# Patient Record
Sex: Female | Born: 1975 | Race: White | Hispanic: No | Marital: Single
Health system: Southern US, Community
[De-identification: ages and names within clinical notes are randomized; demographics above are authoritative.]

---

## 2014-03-28 ENCOUNTER — Other Ambulatory Visit: Payer: Self-pay | Admitting: Bariatrics

## 2014-03-28 DIAGNOSIS — R1011 Right upper quadrant pain: Secondary | ICD-10-CM

## 2014-04-12 ENCOUNTER — Ambulatory Visit
Admission: RE | Admit: 2014-04-12 | Discharge: 2014-04-12 | Disposition: A | Discharge status: Home / Self Care | Payer: BC Managed Care – PPO | Source: Ambulatory Visit | Attending: Bariatrics | Admitting: Bariatrics

## 2014-04-12 DIAGNOSIS — R1011 Right upper quadrant pain: Secondary | ICD-10-CM

## 2014-06-11 ENCOUNTER — Ambulatory Visit: Payer: Self-pay | Admitting: Bariatrics

## 2014-06-19 ENCOUNTER — Inpatient Hospital Stay: Payer: Self-pay | Admitting: Bariatrics

## 2014-06-20 LAB — BASIC METABOLIC PANEL
Anion Gap: 5 — ABNORMAL LOW (ref 7–16)
BUN: 4 mg/dL — ABNORMAL LOW (ref 7–18)
CALCIUM: 8.1 mg/dL — AB (ref 8.5–10.1)
Chloride: 108 mmol/L — ABNORMAL HIGH (ref 98–107)
Co2: 25 mmol/L (ref 21–32)
Creatinine: 0.75 mg/dL (ref 0.60–1.30)
EGFR (African American): >60
EGFR (Non-African Amer.): >60
Glucose: 108 mg/dL — ABNORMAL HIGH (ref 65–99)
Osmolality: 273 (ref 275–301)
Potassium: 3.8 mmol/L (ref 3.5–5.1)
SODIUM: 138 mmol/L (ref 136–145)

## 2014-06-20 LAB — CBC WITH DIFFERENTIAL/PLATELET
Basophil #: 0 10*3/uL (ref 0.0–0.1)
Basophil %: 0.3 %
EOS ABS: 0 10*3/uL (ref 0.0–0.7)
Eosinophil %: 0.6 %
HCT: 34.1 % — ABNORMAL LOW (ref 35.0–47.0)
HGB: 11.5 g/dL — AB (ref 12.0–16.0)
LYMPHS ABS: 2.1 10*3/uL (ref 1.0–3.6)
Lymphocyte %: 29.5 %
MCH: 31.7 pg (ref 26.0–34.0)
MCHC: 33.6 g/dL (ref 32.0–36.0)
MCV: 94 fL (ref 80–100)
MONOS PCT: 6.4 %
Monocyte #: 0.5 x10 3/mm (ref 0.2–0.9)
NEUTROS ABS: 4.6 10*3/uL (ref 1.4–6.5)
NEUTROS PCT: 63.2 %
PLATELETS: 240 10*3/uL (ref 150–440)
RBC: 3.62 10*6/uL — AB (ref 3.80–5.20)
RDW: 13.1 % (ref 11.5–14.5)
WBC: 7.3 10*3/uL (ref 3.6–11.0)

## 2014-06-20 LAB — MAGNESIUM: Magnesium: 1.8 mg/dL

## 2014-06-20 LAB — PHOSPHORUS: Phosphorus: 2.3 mg/dL — ABNORMAL LOW (ref 2.5–4.9)

## 2014-06-20 LAB — ALBUMIN: ALBUMIN: 2.9 g/dL — AB (ref 3.4–5.0)

## 2014-09-17 ENCOUNTER — Ambulatory Visit: Payer: Self-pay | Admitting: Physician Assistant

## 2014-10-27 NOTE — Op Note (Signed)
PATIENT NAME:  Desiree Phillips, Desiree Phillips MR#:  [id number] DATE OF BIRTH:  [birth year]  DATE OF PROCEDURE:  06/19/2014  PROCEDURE PERFORMED: Laparoscopic sleeve gastrectomy with a repair of hiatal hernia and cholecystectomy.   PREOPERATIVE DIAGNOSIS:  Long-standing morbid obesity with multiple attempts at dieting.    SECONDARY DIAGNOSIS:  Presence of hiatal hernia and gastroesophageal reflux as noted by upper GI series.  SYMPTOMS:  Presence of gallstones.   POSTOPERATIVE DIAGNOSIS:  Long-standing morbid obesity with multiple attempts at dieting with a 1.5 cm hiatal hernia.   PHYSICIANS IN ATTENDANCE:  Casimiro NeedleMichael A. Alva Garnetyner, MD.   ASSISTANT:  Anabel Halonon Drinkwater, PA.   PROCEDURE NOTE: The patient was brought to the operating room and placed in supine position. General anesthesia obtained with orotracheal intubation. The patient had TED hose and Thromboguards applied. A footboard applied at the end of the operative bed. The chest and abdomen were sterilely prepped and draped. A 5 mm Optiview trocar was introduced in the left upper quadrant of the abdomen under direct visualization. Pneumoperitoneum obtained. Three additional trocars introduced across the upper abdomen and a Nathanson liver retractor introduced through a subxiphoid defect. On elevation of the left lobe of the liver, the prior noted hiatal hernia was identifiable, with significant indentation of the peritoneum at the level of the hiatus. The patient then had division of the gastrohepatic ligament and division of the peritoneum across the anterior hiatus. Blunt dissection was used to reduce herniated peritoneum and also mobilize the esophagus and anterior vagal nerve away from the overlying pericardium. The patient then had division of the peritoneum just lateral to the right crural margin. Blunt dissection used to reduce herniated lesser sac fatty tissue. The phrenoesophageal ligament was then divided on the medial aspect of the left crus. This was then followed  by circumferential mobilization of the lower esophagus within the lower mediastinum. The esophagus and vagal nerves being swept away from the aorta, the pericardium, and the pleural surfaces. This was extended superiorly over a distance of approximately 8 cm, ultimately resulted in delivery of 1.5 cm of esophagus lying comfortably in the abdominal cavity. At this time, a posterior crural repair was performed with 3 interrupted 0 Ethibond sutures. The pylorus was then marked and approximately 4 cm proximal to this, there was division of the vascular pedicles along the greater curvature of the stomach with the dissection extended superiorly over a distance of approximately 8 cm. Posterior attachments of the stomach to the pancreas were taken down by use of the Harmonic scalpel. This was then followed by a series of GIA staple firings to create a medially based gastric tube effect. First firing was a green load staple, placed in relative transverse direction in an effort to avoid narrowing of the incisura. A second gold load staple was also placed in a similar fashion. This was followed by introduction of a 436 [place] ViSiGi device into the stapled aspect of the antrum and the remaining portion of the stomach decompressed. The patient had additional firings of gold load staples brought out parallel to the lesser curvature of the stomach to create a medially-based gastric tube effect. As this was being performed, the ViSiGi device was gradually withdrawn to create a consistent tubular effect. The stomach was occluded distally and with the ViSiGi device, insufflation of the stomach tube was performed. With a saline bath, no air leak identified. The ViSiGi device was withdrawn. The residual vascular pedicles of the greater curvature of the stomach divided, freeing both the gastrosplenic and gastrocolic  ligaments. The lateral stomach then retrieved through the right upper quadrant 12 mm trocar site. After confirming  hemostasis along the staple line, the apex of the stomach was affixed to the left crus with 0 Ethibond suture to prevent proximal migration of the stomach into the lower mediastinum. The attention redirected to the gallbladder. The peritoneum at the base of the gallbladder was scored by use of the Harmonic scalpel, exposing the base of the gallbladder and origin of the cystic duct. Immediately proximal to this, several branches of the cystic artery  divided by use of the Harmonic scalpel. A retrograde dissection of the gallbladder was then performed, freeing it from the liver bed. This was extended inferiorly to a point in which the cystic duct was reidentified and dissected further proximally over a distance of approximately 1.5 cm, then controlled by 0 PDS Endoloop, followed by division of the cystic duct near its junction with the gallbladder. The wall of the gallbladder and its contents cleared by way of the right upper quadrant trocar site. This did require retrieval of both liquid and several stones from the gallbladder to allow its removal from the abdominal cavity. The peritoneum and fascia of the defect then closed with 0 Vicryl suture, passed by way of a needle suture system under direct visualization.  The carbon dioxide released. The trocars were removed. The wounds injected with 0.25% Marcaine, closed with 4-0 Monocryl in the dermis, followed by Dermabond. The patient was allowed to recover at this point, having tolerated the procedure well with minimal blood loss.     ____________________________ Tyrone Apple Alva Garnet, MD mat:mw D: 06/19/2014 13:57:07 ET T: 06/19/2014 14:32:00 ET JOB#: [id number]  cc: Casimiro Needle A. Alva Garnet, MD, <Dictator> Everette Rank MD ELECTRONICALLY SIGNED 06/20/2014 8:38

## 2014-10-29 LAB — SURGICAL PATHOLOGY

## 2016-03-30 IMAGING — US US ABDOMEN LIMITED
1 series · 14 of 25 positions shown · non-contrast
Comparison: None.

CLINICAL DATA: Right upper quadrant pain, acute ; morbid obesity

EXAM:
US ABDOMEN LIMITED - RIGHT UPPER QUADRANT

[Series 1: us abdomen limited · 0.32mm/px · 14 of 59 slices shown]
[im 1/59]
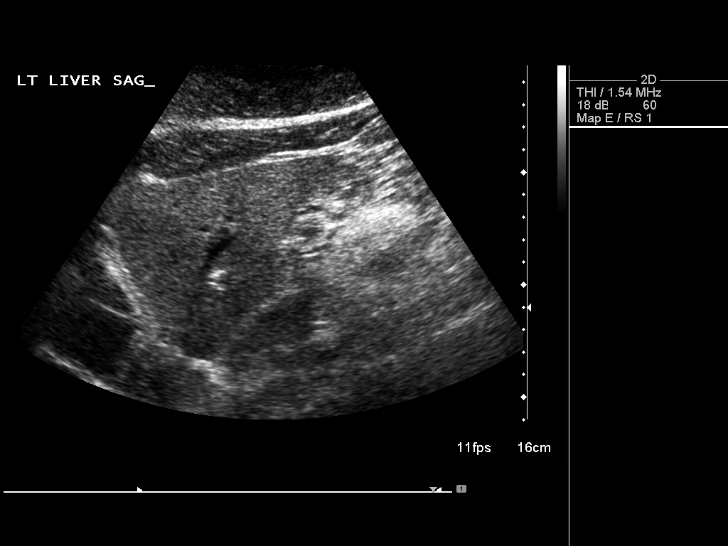
[im 5/59]
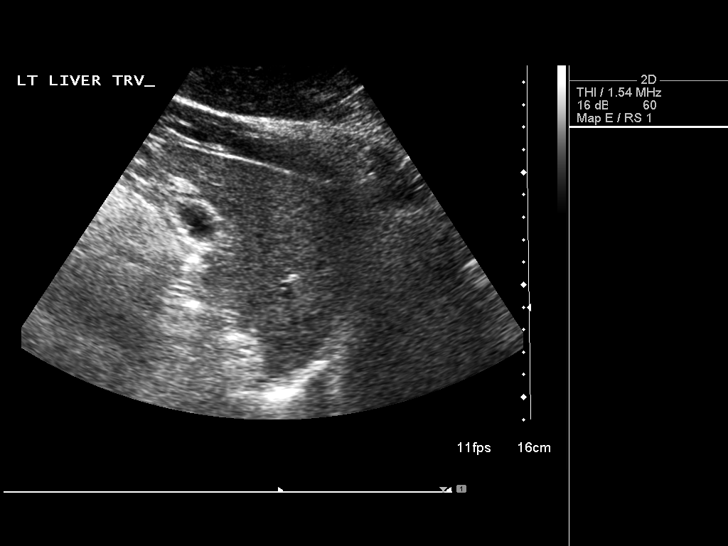
[im 10/59]
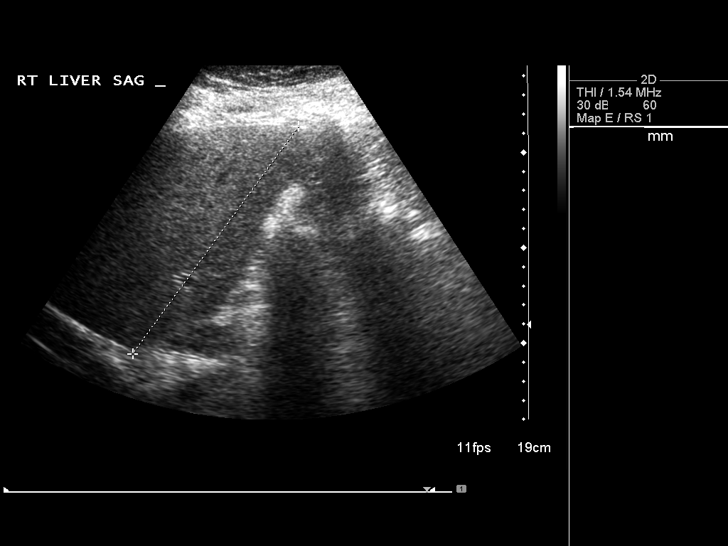
[im 15/59]
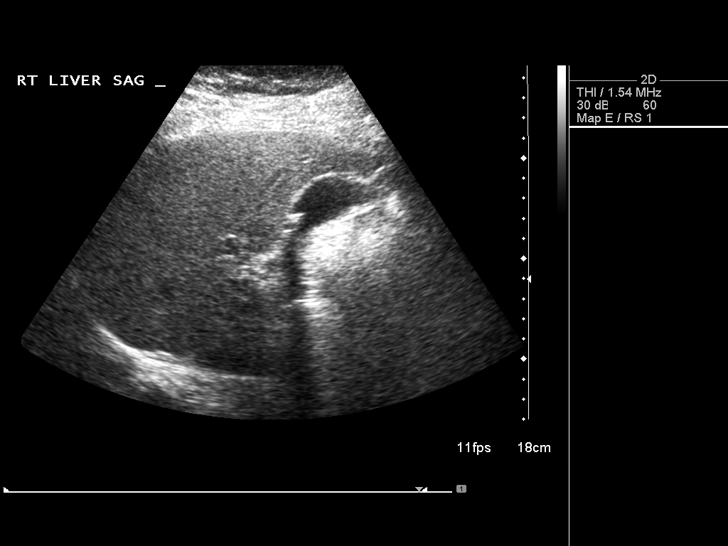
[im 20/59]
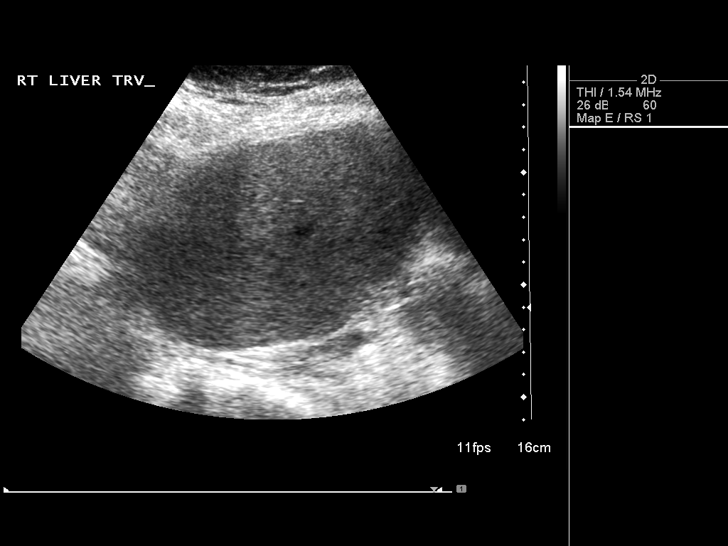
[im 22/59]
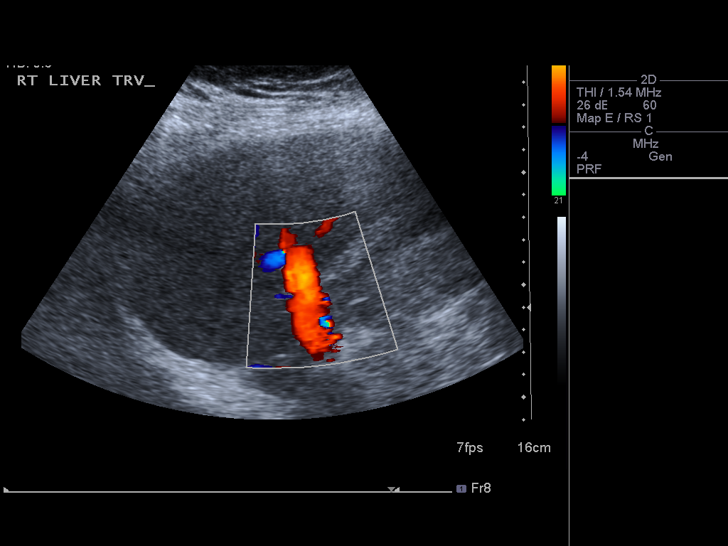
[im 27/59]
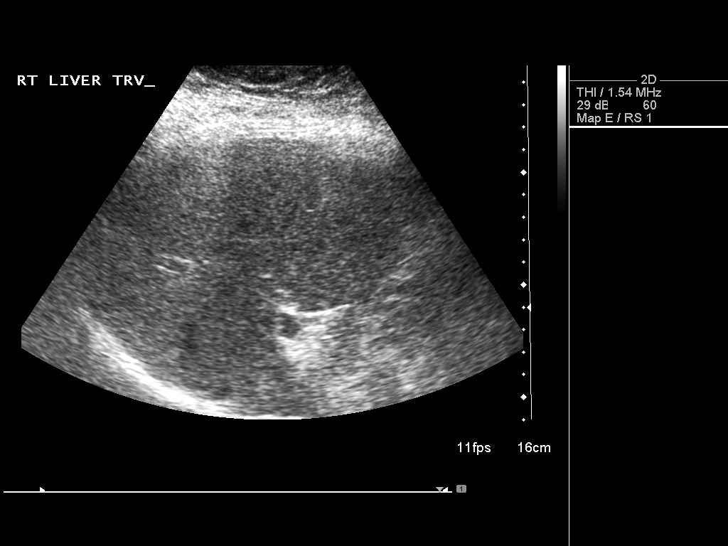
[im 32/59]
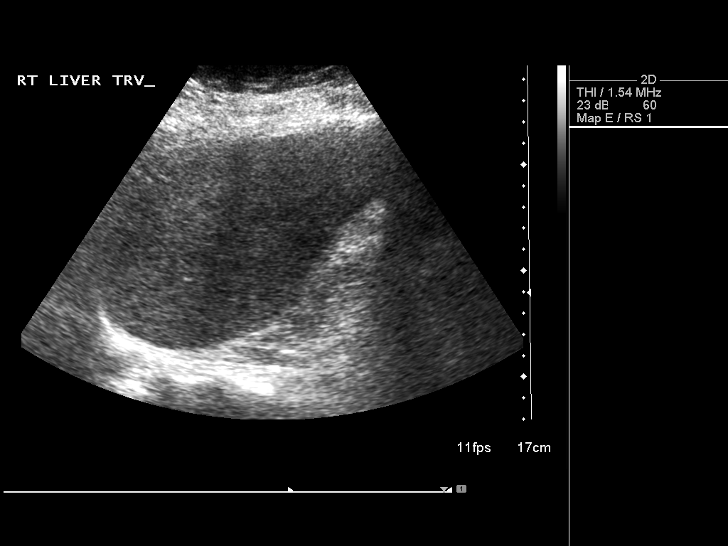
[im 37/59]
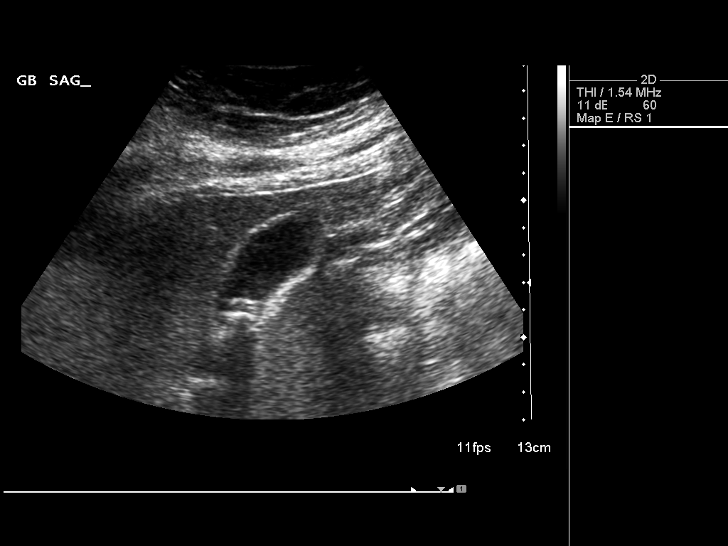
[im 39/59]
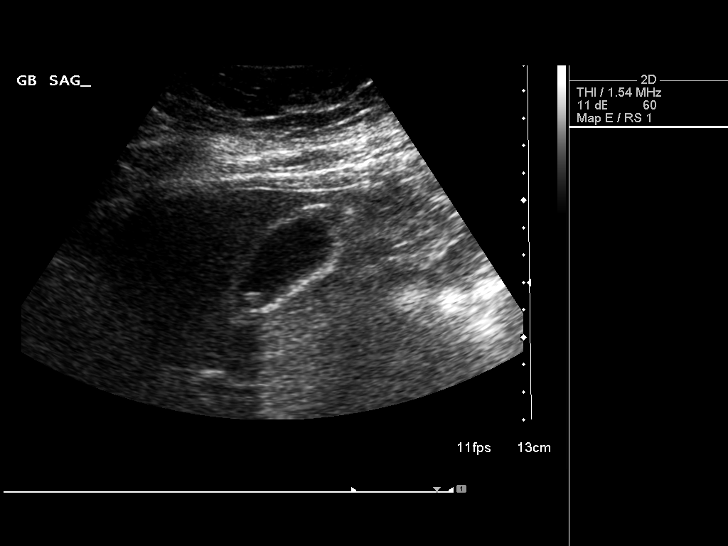
[im 44/59]
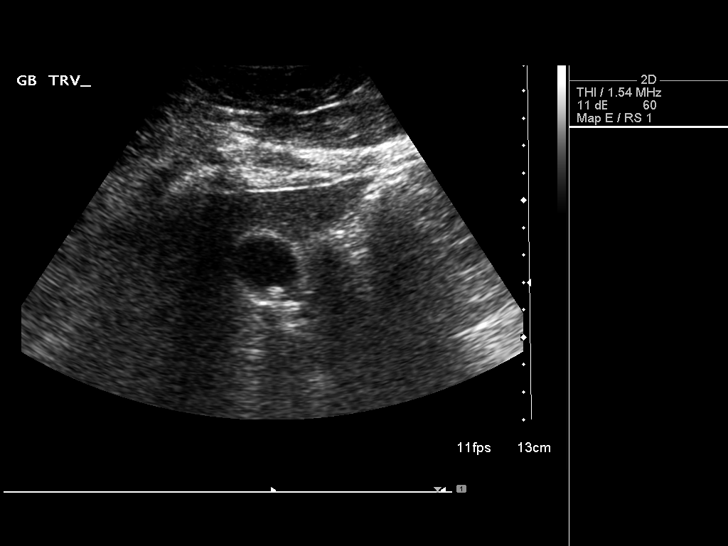
[im 49/59]
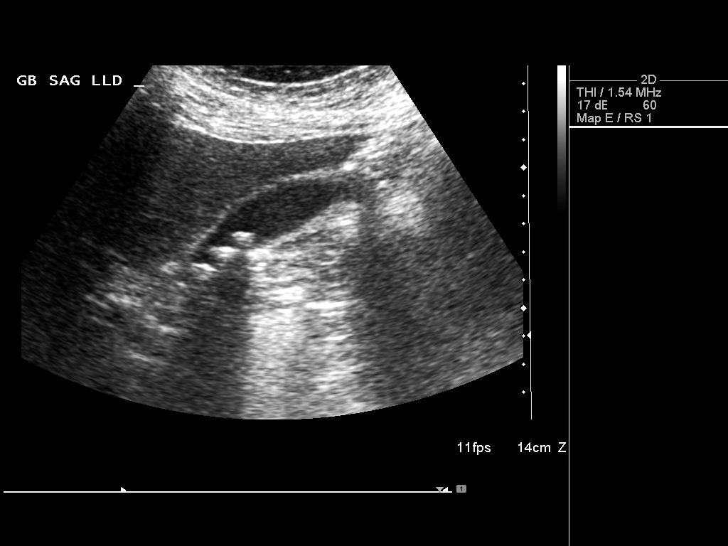
[im 54/59]
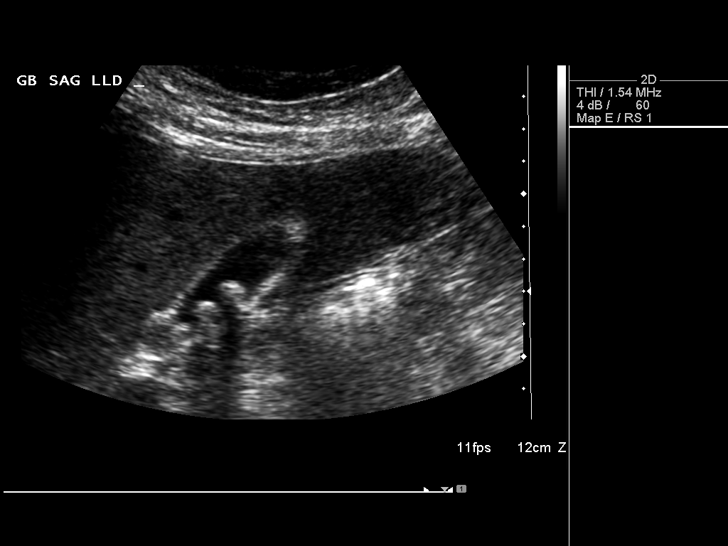
[im 59/59]
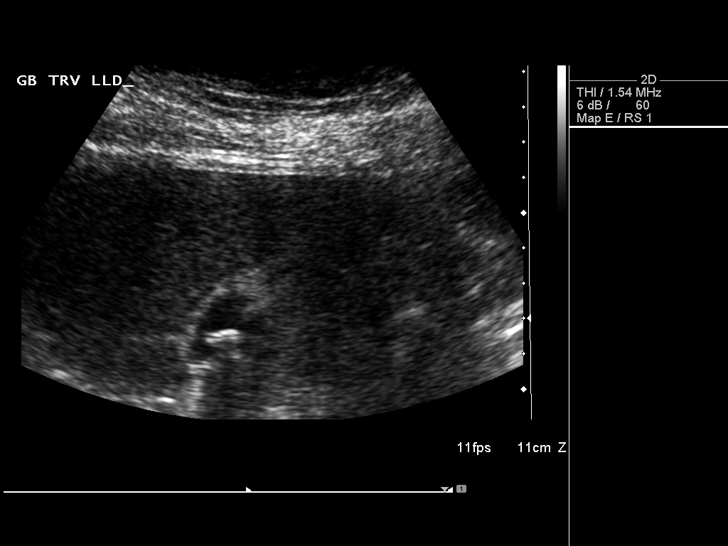

[14 of 25 positions shown; findings below may reference images not displayed]

FINDINGS: Gallbladder:

Within the gallbladder, there are multiple echogenic foci which move
and shadow. The largest gallstone measures 1.1 cm in length. There
is no gallbladder wall thickening or pericholecystic fluid. No
sonographic Murphy sign noted.

Common bile duct:

Diameter: 5 mm. There is no intrahepatic or extrahepatic biliary
duct dilatation.

Liver:

No focal lesion identified. Liver echogenicity is mildly increased
overall.
IMPRESSION: Cholelithiasis. Mild generalized increase in liver echogenicity, a
finding most likely due to hepatic steatosis. While no focal liver
lesions are identified, it must be cautioned that the sensitivity of
ultrasound for focal liver lesions is diminished in this
circumstance.

## 2016-09-04 IMAGING — CR DG CHEST 2V
2 series · 2 of 2 positions shown · non-contrast
Comparison: [HOSPITAL] chest radiographs 04/12/2014

CLINICAL DATA: 38-year-old female with cough and fever for 3 weeks.
Initial encounter.

EXAM:
CHEST  2 VIEW

[chest pa]
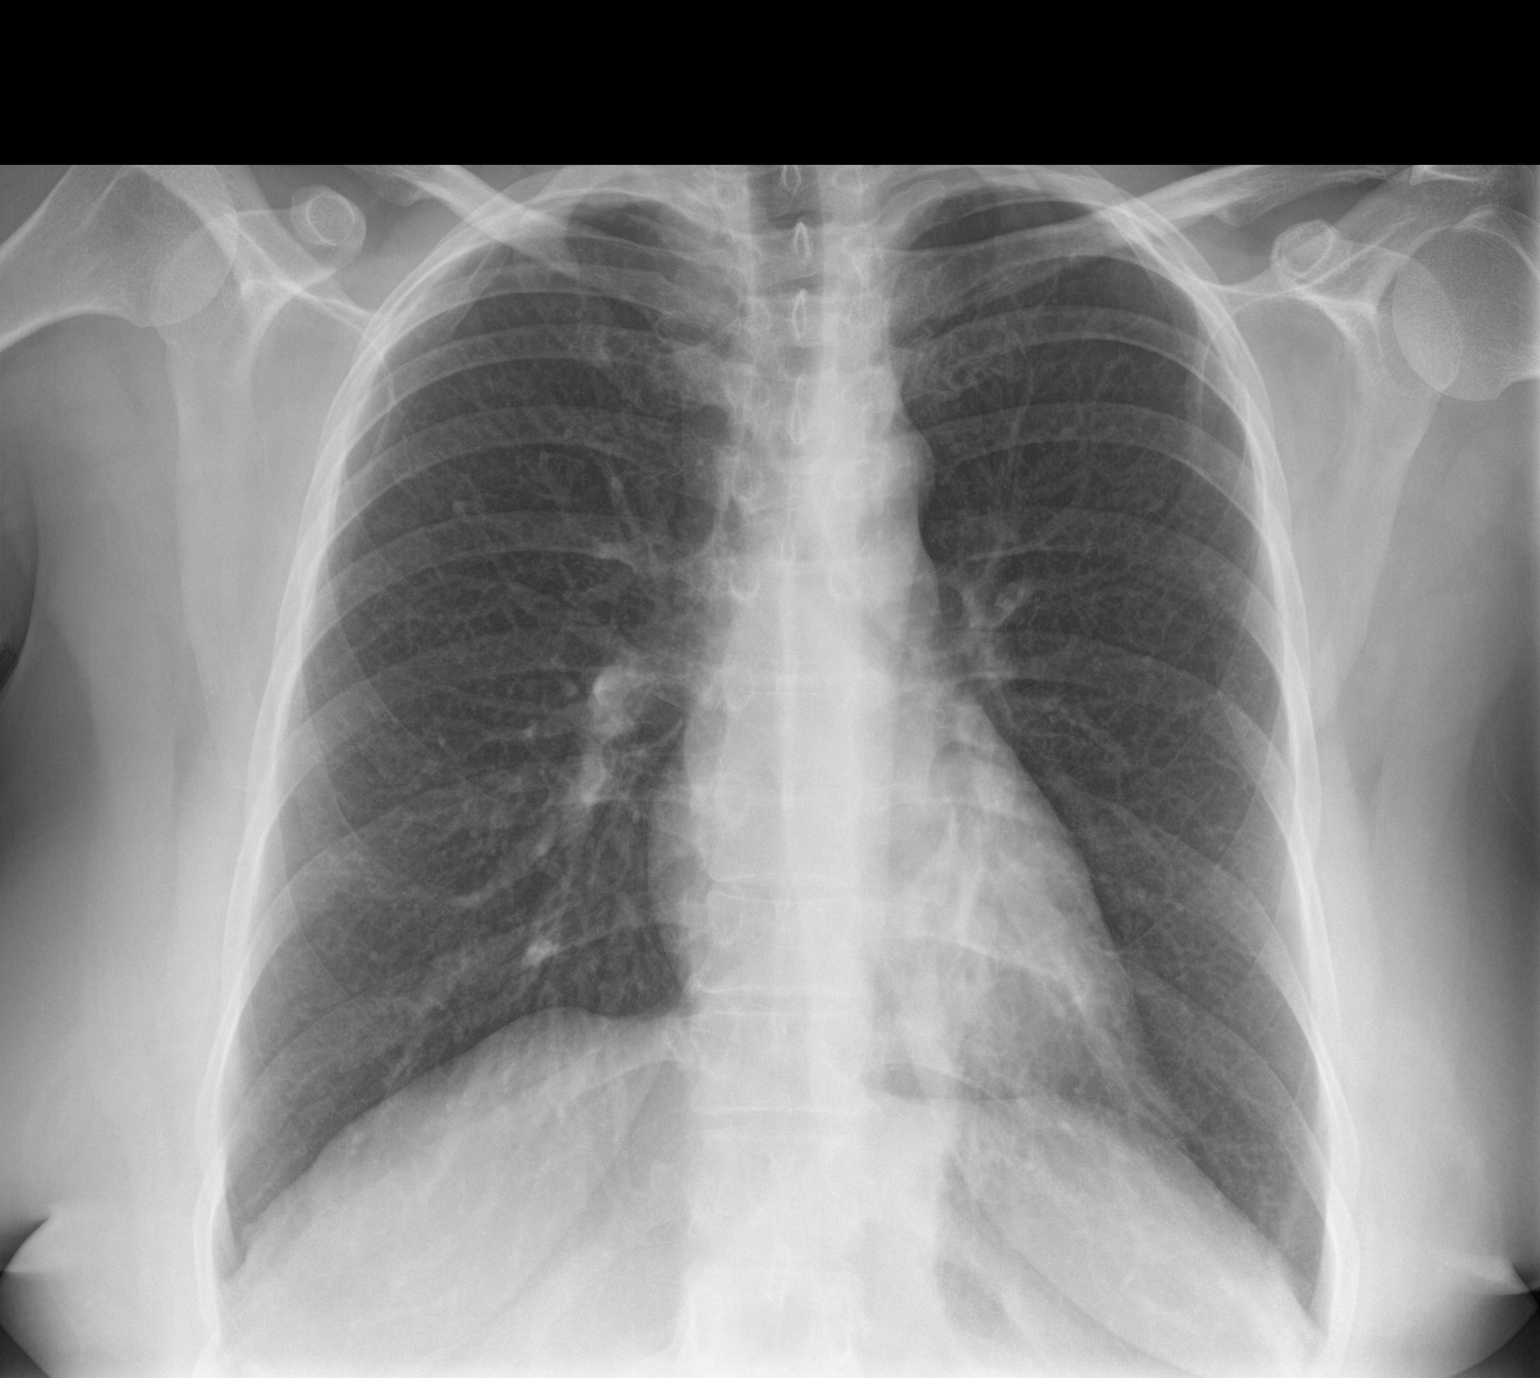

[chest lat]
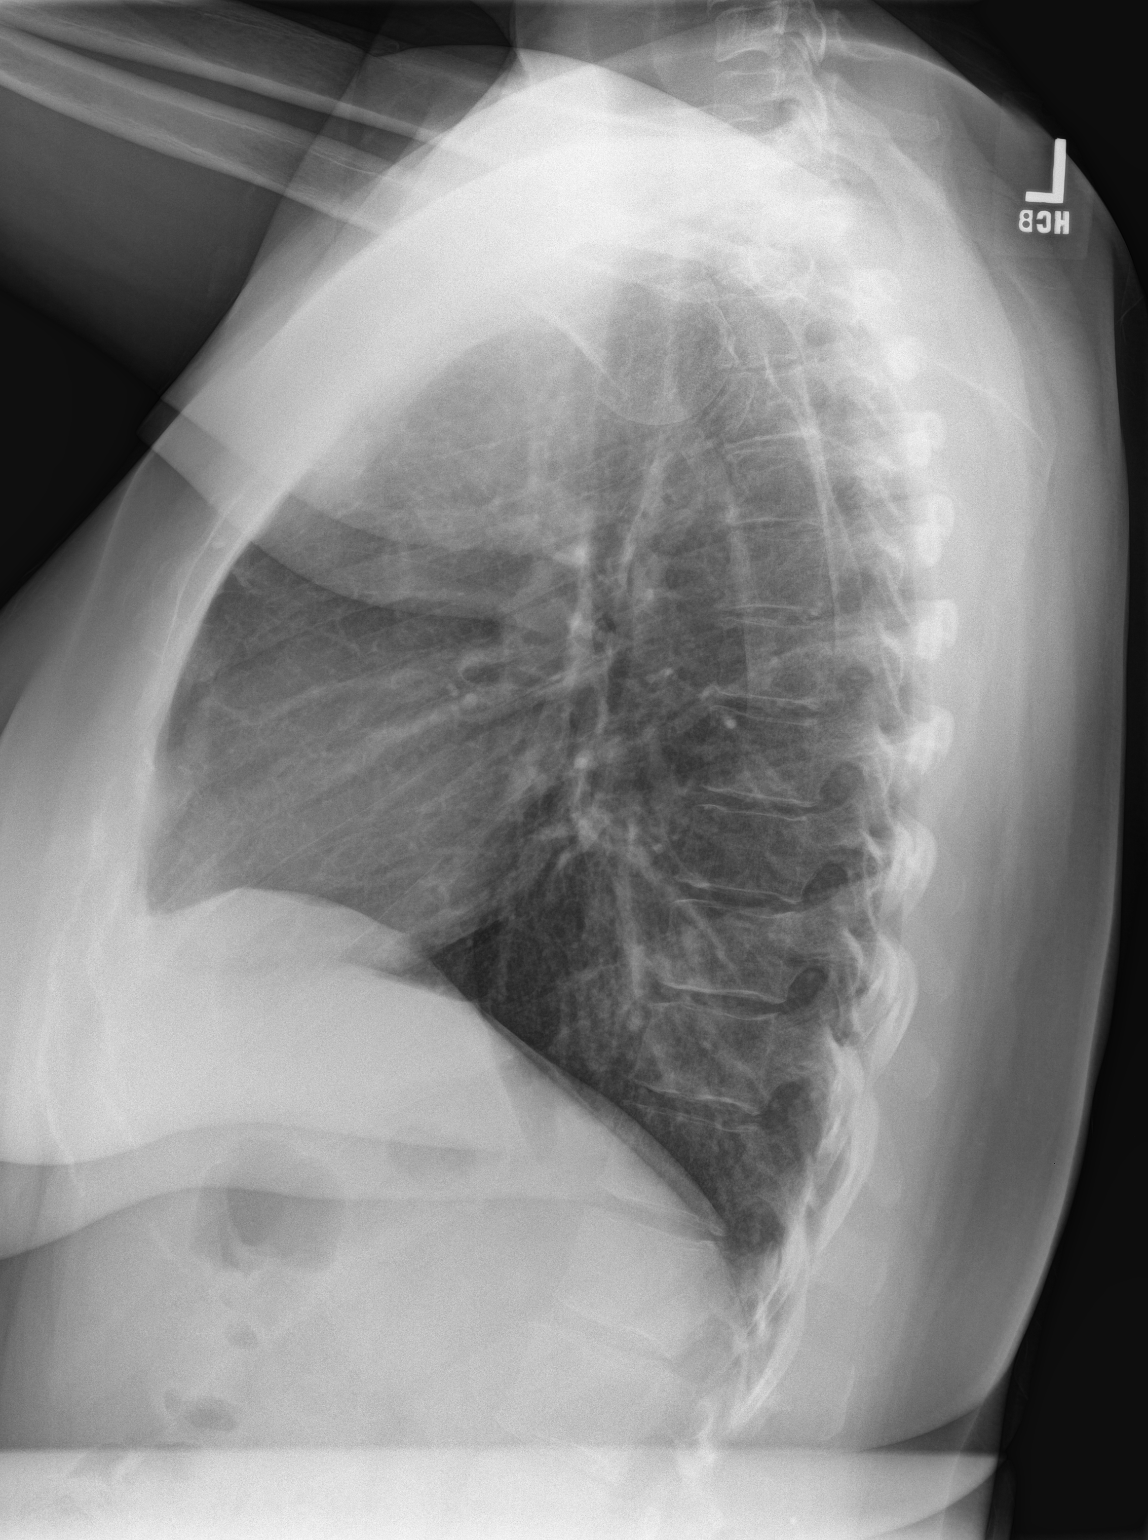

[2 of 2 positions shown; findings below may reference images not displayed]

FINDINGS: Lung volumes are stable and within normal limits. Normal cardiac
size and mediastinal contours. Visualized tracheal air column is
within normal limits. No pneumothorax, pulmonary edema, or pleural
effusion. There is new confluent retrocardiac opacity on the frontal
view, not well correlated on the lateral. No other confluent
pulmonary opacity. No acute osseous abnormality identified.
IMPRESSION: Left lower lobe pneumonia.

Post treatment radiographs recommended to document resolution.

These results will be called to the ordering clinician or
representative by the Radiologist Assistant, and communication
documented in the PACS or zVision Dashboard.
